# Patient Record
Sex: Female | Born: 1990 | Race: Black or African American | Hispanic: No | Marital: Single
Health system: Southern US, Community
[De-identification: ages and names within clinical notes are randomized; demographics above are authoritative.]

---
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
## 2011-07-20 ENCOUNTER — Emergency Department: Payer: Self-pay | Admitting: *Deleted

## 2011-10-31 ENCOUNTER — Emergency Department: Payer: Self-pay | Admitting: Emergency Medicine

## 2012-01-23 ENCOUNTER — Emergency Department: Payer: Self-pay | Admitting: *Deleted

## 2012-03-09 ENCOUNTER — Emergency Department: Payer: Self-pay | Admitting: Emergency Medicine

## 2012-04-07 ENCOUNTER — Emergency Department: Payer: Self-pay | Admitting: Emergency Medicine

## 2012-07-07 ENCOUNTER — Emergency Department: Payer: Self-pay | Admitting: Emergency Medicine

## 2012-07-08 ENCOUNTER — Emergency Department: Payer: Self-pay | Admitting: Emergency Medicine

## 2012-07-08 LAB — URINALYSIS, COMPLETE
Bacteria: NONE SEEN
Glucose,UR: NEGATIVE mg/dL (ref 0–75)
Nitrite: NEGATIVE
RBC,UR: 1 /HPF (ref 0–5)
Specific Gravity: 1.023 (ref 1.003–1.030)
WBC UR: 5 /HPF (ref 0–5)

## 2012-07-08 LAB — CBC
HCT: 35.9 % (ref 35.0–47.0)
HGB: 11.1 g/dL — ABNORMAL LOW (ref 12.0–16.0)
MCHC: 30.9 g/dL — ABNORMAL LOW (ref 32.0–36.0)
MCV: 91 fL (ref 80–100)
RDW: 12.7 % (ref 11.5–14.5)
WBC: 13.7 10*3/uL — ABNORMAL HIGH (ref 3.6–11.0)

## 2012-07-17 ENCOUNTER — Emergency Department: Payer: Self-pay | Admitting: Emergency Medicine

## 2012-08-02 ENCOUNTER — Emergency Department: Payer: Self-pay | Admitting: *Deleted

## 2012-08-02 LAB — CBC
HCT: 37.2 % (ref 35.0–47.0)
HGB: 12.1 g/dL (ref 12.0–16.0)
MCH: 28.8 pg (ref 26.0–34.0)
MCHC: 32.5 g/dL (ref 32.0–36.0)
MCV: 89 fL (ref 80–100)
Platelet: 308 10*3/uL (ref 150–440)
RBC: 4.2 10*6/uL (ref 3.80–5.20)
RDW: 13 % (ref 11.5–14.5)
WBC: 14.7 10*3/uL — ABNORMAL HIGH (ref 3.6–11.0)

## 2012-08-02 LAB — COMPREHENSIVE METABOLIC PANEL
Bilirubin,Total: 0.2 mg/dL (ref 0.2–1.0)
Calcium, Total: 8.9 mg/dL — ABNORMAL LOW (ref 9.0–10.7)
Chloride: 106 mmol/L (ref 98–107)
Co2: 26 mmol/L (ref 21–32)
EGFR (African American): >60
EGFR (Non-African Amer.): >60
Glucose: 98 mg/dL (ref 65–99)
Osmolality: 274 (ref 275–301)
Potassium: 3.8 mmol/L (ref 3.5–5.1)
Sodium: 137 mmol/L (ref 136–145)
Total Protein: 7.6 g/dL (ref 6.4–8.6)

## 2012-08-02 LAB — URINALYSIS, COMPLETE
Bacteria: NONE SEEN
Blood: NEGATIVE
Glucose,UR: NEGATIVE mg/dL (ref 0–75)
Ketone: NEGATIVE
Nitrite: NEGATIVE
Protein: NEGATIVE
Specific Gravity: 1.03 (ref 1.003–1.030)
WBC UR: 2 /HPF (ref 0–5)

## 2012-08-02 LAB — HCG, QUANTITATIVE, PREGNANCY: Beta Hcg, Quant.: 145796 m[IU]/mL — ABNORMAL HIGH

## 2012-08-21 ENCOUNTER — Emergency Department: Payer: Self-pay | Admitting: Emergency Medicine

## 2012-10-17 ENCOUNTER — Observation Stay: Payer: Self-pay

## 2012-10-17 LAB — URINALYSIS, COMPLETE
Blood: NEGATIVE
Glucose,UR: NEGATIVE mg/dL (ref 0–75)
Ph: 8 (ref 4.5–8.0)
Specific Gravity: 1.015 (ref 1.003–1.030)

## 2012-10-19 LAB — URINE CULTURE

## 2012-11-04 ENCOUNTER — Observation Stay: Payer: Self-pay

## 2013-01-19 ENCOUNTER — Observation Stay: Payer: Self-pay | Admitting: Obstetrics and Gynecology

## 2013-01-19 LAB — URINALYSIS, COMPLETE
Bilirubin,UR: NEGATIVE
Glucose,UR: NEGATIVE mg/dL (ref 0–75)
Ph: 6 (ref 4.5–8.0)
Specific Gravity: 1.011 (ref 1.003–1.030)

## 2013-01-19 LAB — CBC WITH DIFFERENTIAL/PLATELET
Eosinophil #: 0.1 10*3/uL (ref 0.0–0.7)
HCT: 28.7 % — ABNORMAL LOW (ref 35.0–47.0)
Lymphocyte #: 1.6 10*3/uL (ref 1.0–3.6)
MCH: 28.3 pg (ref 26.0–34.0)
MCV: 87 fL (ref 80–100)
Monocyte %: 8.7 %
RDW: 12.6 % (ref 11.5–14.5)

## 2013-02-09 ENCOUNTER — Observation Stay: Payer: Self-pay | Admitting: Obstetrics and Gynecology

## 2013-02-20 ENCOUNTER — Observation Stay: Payer: Self-pay

## 2013-02-21 ENCOUNTER — Inpatient Hospital Stay: Payer: Self-pay | Admitting: Obstetrics and Gynecology

## 2013-02-21 LAB — CBC WITH DIFFERENTIAL/PLATELET
Basophil %: 1.3 %
Eosinophil %: 0.3 %
HCT: 30.6 % — ABNORMAL LOW (ref 35.0–47.0)
HGB: 9.8 g/dL — ABNORMAL LOW (ref 12.0–16.0)
Lymphocyte #: 1.4 10*3/uL (ref 1.0–3.6)
Lymphocyte %: 9.2 %
MCH: 27.2 pg (ref 26.0–34.0)
MCHC: 32 g/dL (ref 32.0–36.0)
Monocyte #: 1.6 x10 3/mm — ABNORMAL HIGH (ref 0.2–0.9)
Monocyte %: 10.2 %
Neutrophil #: 12.2 10*3/uL — ABNORMAL HIGH (ref 1.4–6.5)
Neutrophil %: 79 %
RBC: 3.59 10*6/uL — ABNORMAL LOW (ref 3.80–5.20)

## 2013-02-22 LAB — HEMATOCRIT: HCT: 18.4 % — ABNORMAL LOW (ref 35.0–47.0)

## 2013-03-08 ENCOUNTER — Emergency Department: Payer: Self-pay | Admitting: Emergency Medicine

## 2013-03-08 LAB — COMPREHENSIVE METABOLIC PANEL
Bilirubin,Total: 0.3 mg/dL (ref 0.2–1.0)
Calcium, Total: 8.3 mg/dL — ABNORMAL LOW (ref 8.5–10.1)
Co2: 26 mmol/L (ref 21–32)
EGFR (African American): >60
Glucose: 83 mg/dL (ref 65–99)
Osmolality: 278 (ref 275–301)
Potassium: 3.9 mmol/L (ref 3.5–5.1)
SGOT(AST): 21 U/L (ref 15–37)
SGPT (ALT): 15 U/L (ref 12–78)
Sodium: 140 mmol/L (ref 136–145)
Total Protein: 7.7 g/dL (ref 6.4–8.2)

## 2013-03-08 LAB — CBC
HCT: 30.4 % — ABNORMAL LOW (ref 35.0–47.0)
HGB: 10.2 g/dL — ABNORMAL LOW (ref 12.0–16.0)
MCH: 27.9 pg (ref 26.0–34.0)
RDW: 16.4 % — ABNORMAL HIGH (ref 11.5–14.5)
WBC: 13.3 10*3/uL — ABNORMAL HIGH (ref 3.6–11.0)

## 2013-03-08 LAB — URINALYSIS, COMPLETE
Bilirubin,UR: NEGATIVE
Ph: 5 (ref 4.5–8.0)
Protein: NEGATIVE
Squamous Epithelial: 1
WBC UR: 6 /HPF (ref 0–5)

## 2013-03-24 ENCOUNTER — Observation Stay: Payer: Self-pay | Admitting: Obstetrics and Gynecology

## 2013-03-24 LAB — BASIC METABOLIC PANEL
Anion Gap: 5 — ABNORMAL LOW (ref 7–16)
BUN: 8 mg/dL (ref 7–18)
Calcium, Total: 8.7 mg/dL (ref 8.5–10.1)
Chloride: 110 mmol/L — ABNORMAL HIGH (ref 98–107)
Co2: 27 mmol/L (ref 21–32)
EGFR (African American): >60
EGFR (Non-African Amer.): >60
Glucose: 107 mg/dL — ABNORMAL HIGH (ref 65–99)
Potassium: 4.1 mmol/L (ref 3.5–5.1)
Sodium: 142 mmol/L (ref 136–145)

## 2013-03-24 LAB — CBC
HCT: 33.4 % — ABNORMAL LOW (ref 35.0–47.0)
MCV: 84 fL (ref 80–100)
RBC: 4 10*6/uL (ref 3.80–5.20)
RDW: 17.7 % — ABNORMAL HIGH (ref 11.5–14.5)
WBC: 9.7 10*3/uL (ref 3.6–11.0)

## 2013-03-24 LAB — URINALYSIS, COMPLETE
Ketone: NEGATIVE
Nitrite: NEGATIVE
Protein: NEGATIVE
RBC,UR: 1 /HPF (ref 0–5)
Specific Gravity: 1.016 (ref 1.003–1.030)
Squamous Epithelial: 2
WBC UR: 3 /HPF (ref 0–5)

## 2013-03-24 LAB — WET PREP, GENITAL

## 2013-03-25 LAB — CBC WITH DIFFERENTIAL/PLATELET
Basophil #: 0.1 10*3/uL (ref 0.0–0.1)
Basophil %: 0.7 %
MCHC: 32.5 g/dL (ref 32.0–36.0)
Neutrophil #: 6.7 10*3/uL — ABNORMAL HIGH (ref 1.4–6.5)
RBC: 3.68 10*6/uL — ABNORMAL LOW (ref 3.80–5.20)
RDW: 17.8 % — ABNORMAL HIGH (ref 11.5–14.5)
WBC: 11.5 10*3/uL — ABNORMAL HIGH (ref 3.6–11.0)

## 2013-03-26 LAB — CBC WITH DIFFERENTIAL/PLATELET
Basophil #: 0 10*3/uL (ref 0.0–0.1)
Eosinophil #: 0.1 10*3/uL (ref 0.0–0.7)
HCT: 31.7 % — ABNORMAL LOW (ref 35.0–47.0)
MCHC: 32.5 g/dL (ref 32.0–36.0)
MCV: 84 fL (ref 80–100)
Monocyte %: 8.8 %
Neutrophil %: 63.2 %
RBC: 3.77 10*6/uL — ABNORMAL LOW (ref 3.80–5.20)
RDW: 17.4 % — ABNORMAL HIGH (ref 11.5–14.5)
WBC: 9.2 10*3/uL (ref 3.6–11.0)

## 2013-04-13 ENCOUNTER — Emergency Department: Payer: Self-pay | Admitting: Emergency Medicine

## 2013-05-20 IMAGING — CR DG ANKLE COMPLETE 3+V*L*
1 series · 5 of 5 positions shown · non-contrast
Comparison: none

REASON FOR EXAM: pain / injury   unable to bear wt x 1 day    flex 3
8 wks pregnant  shield a
COMMENTS:

[Series 1: x ankle ap left · 0.14mm/px · 5 of 5 slices shown]
[im 1/5]
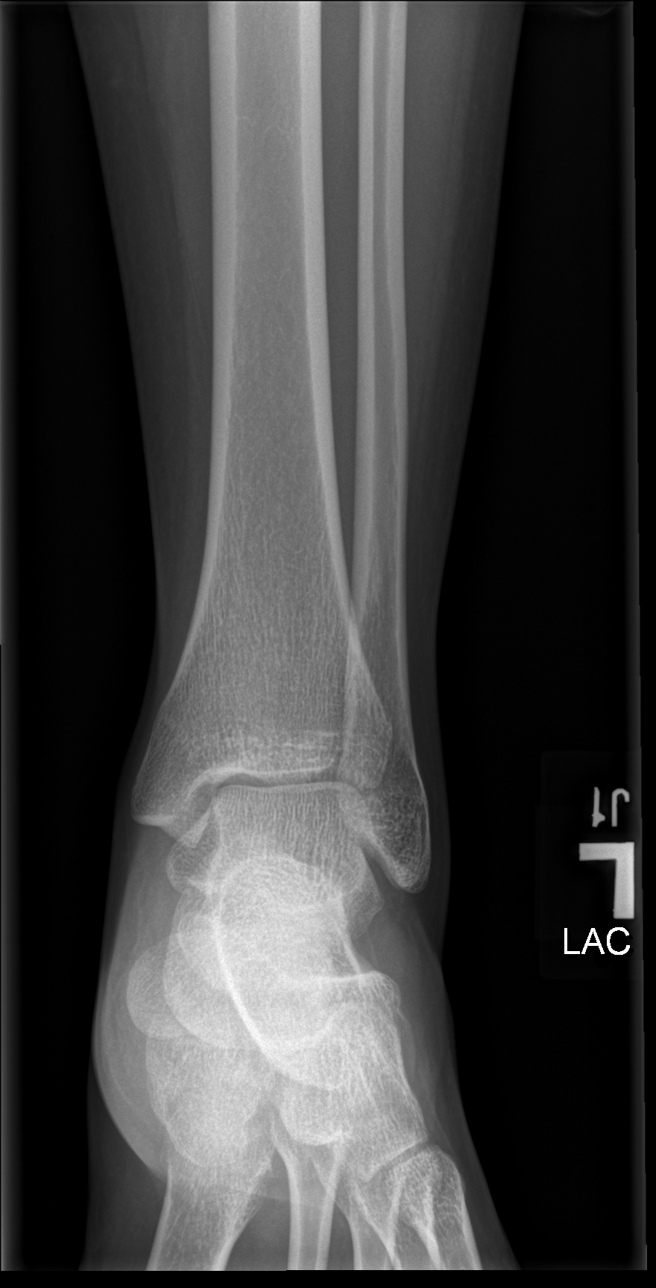
[im 2/5]
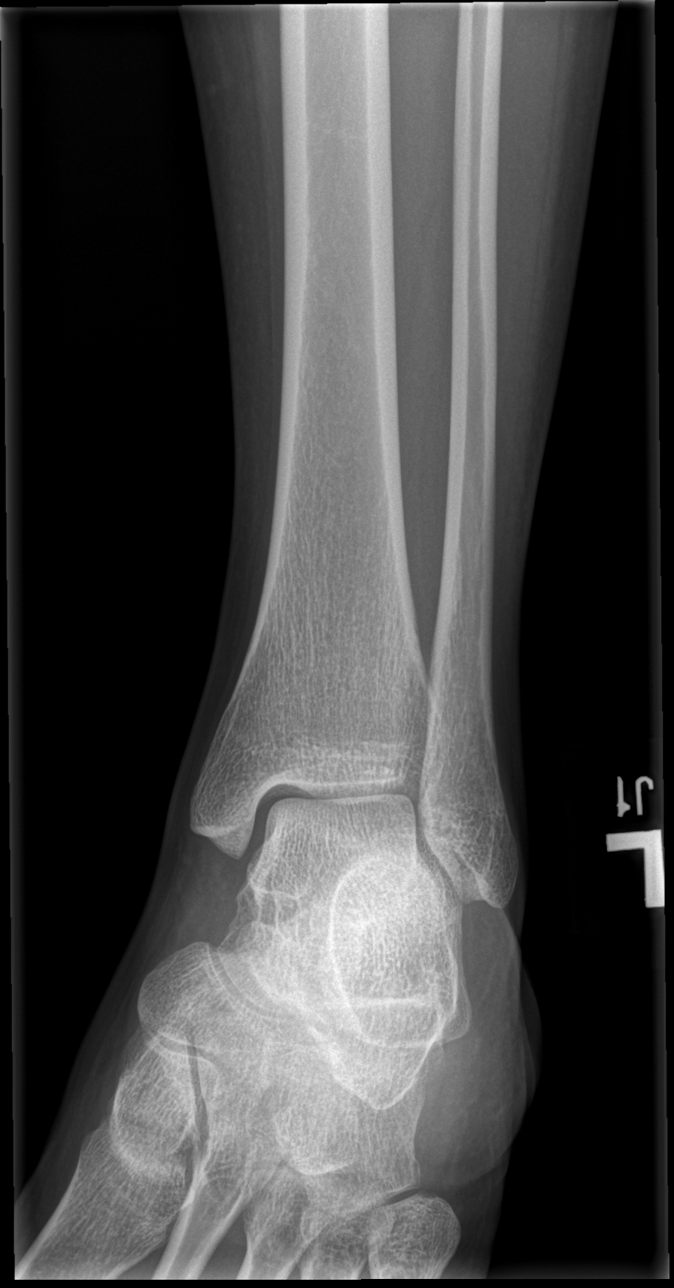
[im 3/5]
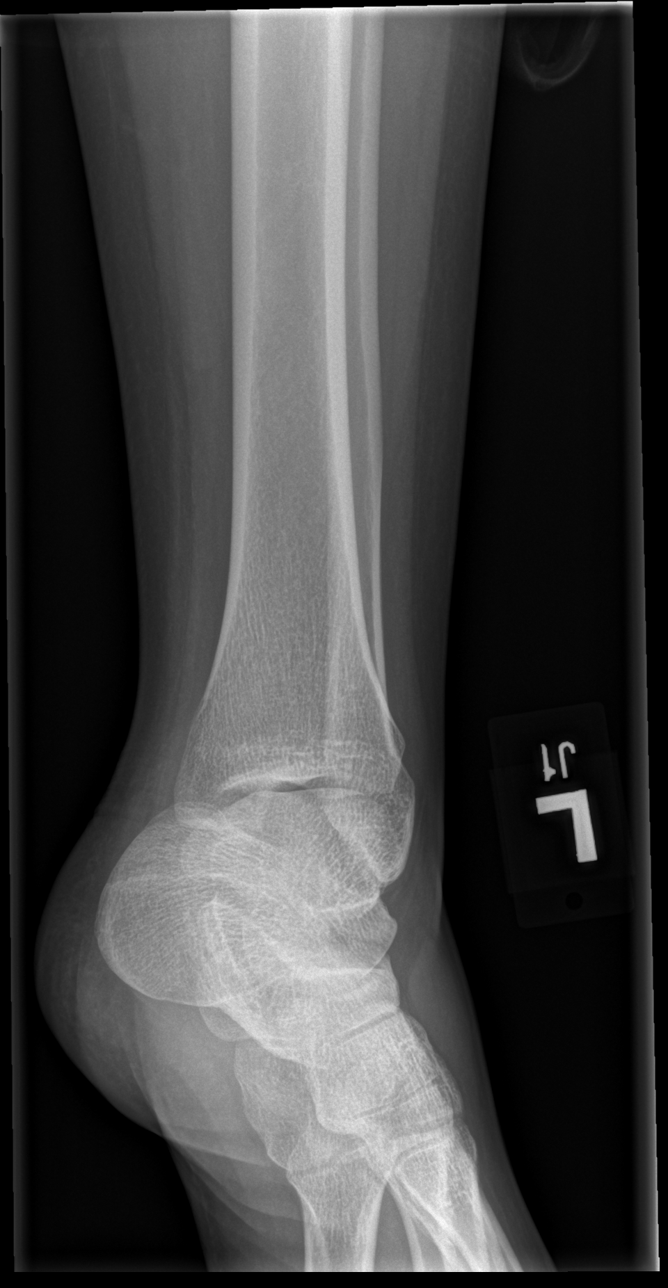
[im 4/5]
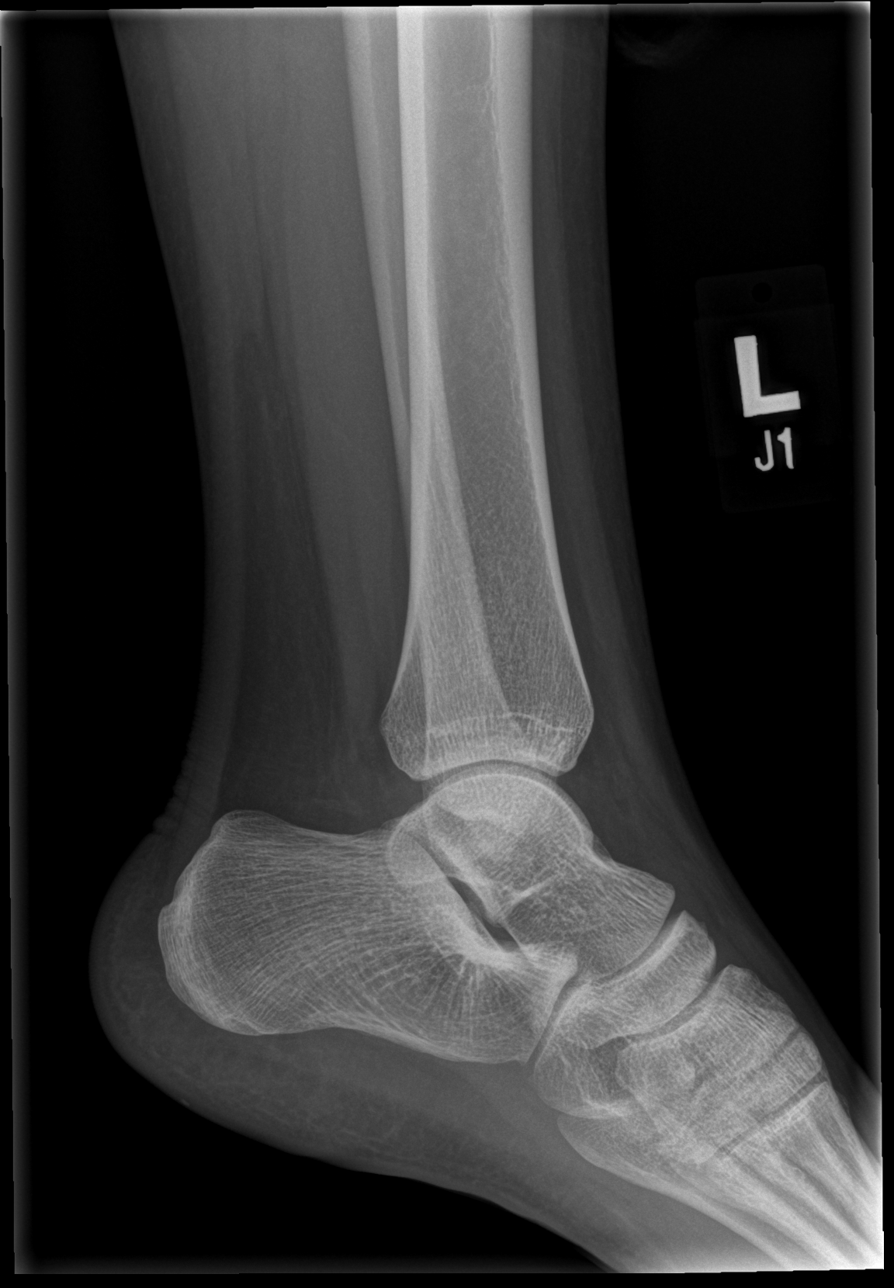
[im 5/5]
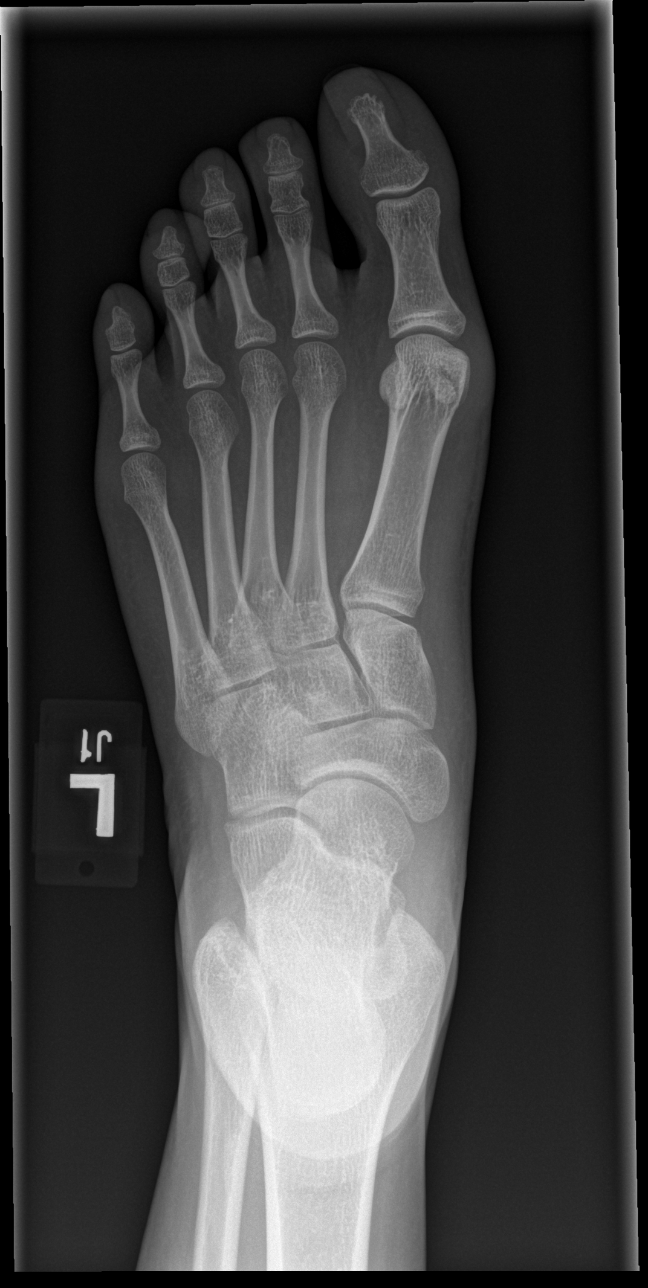

[5 of 5 positions shown; findings below may reference images not displayed]

PROCEDURE:     DXR - DXR ANKLE LEFT COMPLETE  - July 17, 2012 [DATE]

RESULT:     Five views of the left ankle are submitted. The ankle joint
mortise is preserved. The talar dome is intact. There is no evidence of an
acute malleolar fracture. The talus and calcaneus appear intact. The
overlying soft tissues are normal in appearance there the observed bones of
the foot reveal no acute fractures.
IMPRESSION: There is no acute bony abnormality of the left ankle.

[REDACTED]

## 2013-05-31 ENCOUNTER — Emergency Department: Payer: Self-pay | Admitting: Emergency Medicine

## 2013-06-11 ENCOUNTER — Emergency Department: Payer: Self-pay | Admitting: Internal Medicine

## 2013-12-20 ENCOUNTER — Emergency Department: Payer: Self-pay | Admitting: Emergency Medicine

## 2014-06-29 ENCOUNTER — Emergency Department: Payer: Self-pay | Admitting: Emergency Medicine

## 2014-06-29 LAB — URINALYSIS, COMPLETE
Bacteria: NONE SEEN
Bilirubin,UR: NEGATIVE
Blood: NEGATIVE
Glucose,UR: NEGATIVE mg/dL (ref 0–75)
KETONE: NEGATIVE
NITRITE: NEGATIVE
PH: 7 (ref 4.5–8.0)
Protein: NEGATIVE
Specific Gravity: 1.015 (ref 1.003–1.030)

## 2014-06-29 LAB — COMPREHENSIVE METABOLIC PANEL
ALBUMIN: 3.6 g/dL (ref 3.4–5.0)
ALK PHOS: 64 U/L
Anion Gap: 6 — ABNORMAL LOW (ref 7–16)
BILIRUBIN TOTAL: 0.5 mg/dL (ref 0.2–1.0)
BUN: 11 mg/dL (ref 7–18)
CALCIUM: 8.7 mg/dL (ref 8.5–10.1)
CHLORIDE: 105 mmol/L (ref 98–107)
CREATININE: 0.92 mg/dL (ref 0.60–1.30)
Co2: 25 mmol/L (ref 21–32)
EGFR (Non-African Amer.): >60
Glucose: 93 mg/dL (ref 65–99)
OSMOLALITY: 271 (ref 275–301)
Potassium: 4.2 mmol/L (ref 3.5–5.1)
SGOT(AST): 20 U/L (ref 15–37)
SGPT (ALT): 17 U/L (ref 12–78)
Sodium: 136 mmol/L (ref 136–145)
Total Protein: 7.9 g/dL (ref 6.4–8.2)

## 2014-06-29 LAB — CBC WITH DIFFERENTIAL/PLATELET
BASOS ABS: 0.1 10*3/uL (ref 0.0–0.1)
BASOS PCT: 0.5 %
EOS PCT: 1 %
Eosinophil #: 0.1 10*3/uL (ref 0.0–0.7)
HCT: 38.4 % (ref 35.0–47.0)
HGB: 12.7 g/dL (ref 12.0–16.0)
LYMPHS PCT: 20.5 %
Lymphocyte #: 2.6 10*3/uL (ref 1.0–3.6)
MCH: 29.6 pg (ref 26.0–34.0)
MCHC: 33 g/dL (ref 32.0–36.0)
MCV: 90 fL (ref 80–100)
MONO ABS: 0.8 x10 3/mm (ref 0.2–0.9)
Monocyte %: 6.5 %
NEUTROS ABS: 9.2 10*3/uL — AB (ref 1.4–6.5)
NEUTROS PCT: 71.5 %
Platelet: 257 10*3/uL (ref 150–440)
RBC: 4.28 10*6/uL (ref 3.80–5.20)
RDW: 13.1 % (ref 11.5–14.5)
WBC: 12.8 10*3/uL — AB (ref 3.6–11.0)

## 2014-06-29 LAB — WET PREP, GENITAL

## 2014-06-30 LAB — GC/CHLAMYDIA PROBE AMP

## 2015-04-09 NOTE — Op Note (Signed)
PATIENT NAME:  Elaine Malone, Elaine Malone MR#:  [id number] DATE OF BIRTH:  1991/10/20  DATE OF PROCEDURE:  02/21/2013  PREOPERATIVE DIAGNOSIS: Secondary arrest of dilation with occiput posterior presentation.  POSTOPERATIVE DIAGNOSIS: Secondary arrest of dilation with occiput posterior presentation.   PROCEDURE PERFORMED:  Primary lower uterine Cesarean section.  SURGEON: Elliot Gurneyarrie C. Maritza Goldsborough, M.D.   ANESTHESIA: Epidural spinal with local.   ESTIMATED BLOOD LOSS: 1000 mL.   FINDINGS: Term liveborn infant, OP, with normal uterus, tubes and ovaries.  DESCRIPTION OF PROCEDURE: The patient was taken to the operating room and placed in the supine position. After adequate spinal anesthesia was instilled, the patient was prepped and draped in the usual sterile fashion. A Pfannenstiel skin incision was drawn and then cut through approximately 2 fingerbreadths above the pubic symphysis and carried sharply down to the fascia. The fascia was nicked in the midline and extended in superolateral manner.  The rectus fascia was grasped with Kocher clamps and the muscles were sharply and bluntly dissected off the rectus fascia, both superiorly and inferiorly. The muscle belly midline was identified and split. The peritoneum was grasped and entered. A bladder blade was placed. A bladder flap was created. A uterine incision was made and extended with the surgeon's fingers. The infant's head was delivered. The baby was bulb suctioned, cord was clamped and cut, and the infant was handed to the awaiting pediatricians. Pitocin was started. The placenta was delivered. The uterus was exteriorized and wrapped in a moist laparotomy sponge. The interior was curetted with a moist lap sponge. The uterine incision was closed with a running locked chromic suture and then a running embrocating suture. The bladder flap was tacked back up to the incision with a 3-0 chromic. The belly was irrigated with warm normal saline. Clots were removed.  The uterus was placed back into the belly. The gutters were cleared of clots. The muscle bellies were approximated and closed with a running Vicryl suture. The ON-Q pain pump was (Dictation Anomaly) then inserted. The trocars were placed. The catheters were threaded, wrapped around between the muscle bellies and the fascia. The fascia was closed with a running Vicryl suture. The subcutaneous fat was not very much, so a 4-0 Monocryl was placed subcuticularly. Steri-Strips were placed. Dermabond was placed on the catheter port entry. These were wrapped in a 4 x 4 and Tegaderm. Bandage was placed on the incision. The uterus was found to be firm and clear of clots. Clear urine was noted in the Foley bag. The patient was taken to recovery after having tolerated the procedure well.  ____________________________ Elliot Gurneyarrie C. Quantarius Genrich, MD cck:sb D: 03/07/2013 09:11:00 ET T: 03/07/2013 10:17:40 ET JOB#: [id number]  cc: Elliot Gurneyarrie C. Smiley Birr, MD, <Dictator> Elliot GurneyARRIE C Cosme Jacob MD ELECTRONICALLY SIGNED 03/14/2013 10:41

## 2015-04-27 NOTE — H&P (Signed)
L&D Evaluation:  History Expanded:   HPI 24 yo G1 with EDD 02/25/13, presents at 21 weeks with c/o nausea and vomiting, upper abd pain and back pain. Has not vomited since 0700 this am.  Complete PN Records not available.    Presents with see HPI    Medications Pre Natal Vitamins    Allergies NKDA   ROS:   ROS see HPI   Exam:   Vital Signs stable    General no apparent distress    Mental Status clear    Abdomen gravid, non-tender    Back no CVAT, generalized LBP    Pelvic deferred    FHT +FHT's    Ucx absent    Other UA: nitrite negative,RBC  29, WBC 74, LE 3+, Epithelial cells 46   Impression:   Impression nausea of pregnancy, heartburn, back pain, suspected UTI   Plan:   Comments Pt tolerated po fluids. Will try crackers and give Zantac for epigastric pain.  D/c home afterwards if stable.  Given Rx for Zofran for nausea, Zantac for epigastric pain, Macrobid for presumed UTI Will obtain urine culture Discussed comfort measures for low back pain    Follow Up Appointment already scheduled. 11/21   Electronic Signatures: Marta AntuBrothers, Kassiah Mccrory K (CNM)  (Signed 31-Oct-13 14:15)  Authored: L&D Evaluation   Last Updated: 31-Oct-13 14:15 by Vella KohlerBrothers, Mayleen Borrero K (CNM)

## 2015-04-27 NOTE — H&P (Signed)
L&D Evaluation:  History Expanded:   HPI 24 yo G1 with EDD 02/25/13, presents at 23 6/7 weeks with c/o vomiting x 4 yesterday with 2 episodes of blood-streaked emesis. Vomited x 1 this am. Pt was seen 10/31 with c/o vomiting and pain, tx for UTI and given Rx for Zantac and Zofran. Reports she is taking Zantac regularly and Zofran usually helps nausea. She does not have nausea every day. She was worried about baby's well-being as she has a h/o ulcer.    Blood Type (Maternal) O positive    Group B Strep Results Maternal (Result >5wks must be treated as unknown) unknown/result > 5 weeks ago    Maternal HIV Negative    Maternal Syphilis Ab Nonreactive    Maternal Varicella Immune    Rubella Results (Maternal) immune    Maternal T-Dap Unknown    Presents with see HPI    Patient's Medical History ulcer?    Medications Pre Natal Vitamins    Allergies NKDA   ROS:   ROS see HPI   Exam:   Vital Signs stable    General no apparent distress    Mental Status clear    Abdomen gravid, non-tender    Back no CVAT    Pelvic deferred    Mebranes Intact    FHT appropriate for gestational age    Ucx absent   Impression:   Impression nausea/vomiting, hematemesis (resolved)   Plan:   Comments Continue Zantac and Zofran prn Reassured that hematemesis appears to be mild and r/t frequent emesis yesterday, f/u prn if sx become severe    Follow Up Appointment already scheduled. 11/21   Electronic Signatures: Marta AntuBrothers, Rhylen Pulido K (CNM)  (Signed [id number]-Nov-13 10:34)  Authored: L&D Evaluation   Last Updated: 18-Nov-13 10:34 by Vella KohlerBrothers, Lyrik Buresh K (CNM)

## 2015-04-27 NOTE — H&P (Signed)
L&D Evaluation:  History Expanded:  HPI 24 yo G1 with EDD 02/25/13, presents at 9039 2/7weeks with c/o regular contractions. Cervix was 3 cm at office on 2/28, per RN cervix 3.5 cm on admission. Pt with c/o leaking shortly after admission. [facility], +Trichomonas in December, Several episodes of threatened PTL.   Blood Type (Maternal) O positive   Group B Strep Results Maternal (Result >5wks must be treated as unknown) negative   Maternal HIV Negative   Maternal Syphilis Ab Nonreactive   Maternal Varicella Immune   Rubella Results (Maternal) immune   Maternal T-Dap Unknown   Presents with see HPI   Patient's Medical History ulcer?   Patient's Surgical History none   Medications Pre Natal Vitamins   Allergies NKDA   Social History none   ROS:  ROS see HPI   Exam:  Vital Signs stable   General no apparent distress   Mental Status clear   Abdomen gravid, tender with contractions   Pelvic 3.5 per RN   Mebranes Intact   FHT normal rate with no decels   Ucx regular, q 5-7  minutes, moderate intensity   Skin dry   Other Fern negative, Nitrizine equivocal (had been checked previously) [employer] negative   Impression:  Impression early labor   Plan:  Comments Pt to ambulate in hallways x 2 hours, RN to reassess cervix upon return If unchanged will d/c home.   Electronic Signatures: Vella KohlerBrothers, Tamara K (CNM)  (Signed 06-Mar-14 10:03)  Authored: L&D Evaluation   Last Updated: 06-Mar-14 10:03 by Vella KohlerBrothers, Tamara K (CNM)
